# Patient Record
Sex: Male | Born: 1985 | Race: Black or African American | Hispanic: No | Marital: Single | Smoking: Current every day smoker
Health system: Southern US, Community
[De-identification: ages and names within clinical notes are randomized; demographics above are authoritative.]

## PROBLEM LIST (undated history)

## (undated) ENCOUNTER — Emergency Department: Admission: EM | Payer: Self-pay | Source: Home / Self Care

## (undated) DIAGNOSIS — M549 Dorsalgia, unspecified: Secondary | ICD-10-CM

---

## 2020-01-14 ENCOUNTER — Other Ambulatory Visit: Payer: Self-pay

## 2020-07-26 ENCOUNTER — Encounter: Payer: Self-pay | Admitting: *Deleted

## 2020-07-26 ENCOUNTER — Other Ambulatory Visit: Payer: Self-pay

## 2020-07-26 ENCOUNTER — Emergency Department: Payer: Self-pay

## 2020-07-26 DIAGNOSIS — F1590 Other stimulant use, unspecified, uncomplicated: Secondary | ICD-10-CM | POA: Insufficient documentation

## 2020-07-26 DIAGNOSIS — Z20822 Contact with and (suspected) exposure to covid-19: Secondary | ICD-10-CM | POA: Insufficient documentation

## 2020-07-26 DIAGNOSIS — F17200 Nicotine dependence, unspecified, uncomplicated: Secondary | ICD-10-CM | POA: Insufficient documentation

## 2020-07-26 DIAGNOSIS — I309 Acute pericarditis, unspecified: Secondary | ICD-10-CM | POA: Insufficient documentation

## 2020-07-26 LAB — CBC
HCT: 45.4 % (ref 39.0–52.0)
Hemoglobin: 14.4 g/dL (ref 13.0–17.0)
MCH: 23.2 pg — ABNORMAL LOW (ref 26.0–34.0)
MCHC: 31.7 g/dL (ref 30.0–36.0)
MCV: 73.2 fL — ABNORMAL LOW (ref 80.0–100.0)
Platelets: 145 10*3/uL — ABNORMAL LOW (ref 150–400)
RBC: 6.2 MIL/uL — ABNORMAL HIGH (ref 4.22–5.81)
RDW: 17.5 % — ABNORMAL HIGH (ref 11.5–15.5)
WBC: 6.9 10*3/uL (ref 4.0–10.5)
nRBC: 0 % (ref 0.0–0.2)

## 2020-07-26 LAB — BASIC METABOLIC PANEL
Anion gap: 8 (ref 5–15)
BUN: 14 mg/dL (ref 6–20)
CO2: 31 mmol/L (ref 22–32)
Calcium: 9.1 mg/dL (ref 8.9–10.3)
Chloride: 103 mmol/L (ref 98–111)
Creatinine, Ser: 1.13 mg/dL (ref 0.61–1.24)
GFR, Estimated: >60 mL/min (ref >60)
Glucose, Bld: 90 mg/dL (ref 70–99)
Potassium: 3.9 mmol/L (ref 3.5–5.1)
Sodium: 142 mmol/L (ref 135–145)

## 2020-07-26 LAB — FIBRIN DERIVATIVES D-DIMER (ARMC ONLY): Fibrin derivatives D-dimer (ARMC): 166.63 ng/mL (FEU) (ref 0.00–499.00)

## 2020-07-26 LAB — TROPONIN I (HIGH SENSITIVITY)
Troponin I (High Sensitivity): 8 ng/L (ref <18)
Troponin I (High Sensitivity): 7 ng/L (ref <18)

## 2020-07-26 NOTE — ED Notes (Signed)
Pt refused Covid swab.

## 2020-07-26 NOTE — ED Triage Notes (Signed)
Pt states woke @ 0300 this morning w/ shortness of breath and CP. Pt states at the time he felt hot and nauseated. States he used bathroom and returned to bed and to sleep. Pt states he has had persistent chest pain which is reproducible w/ inspiration. Pt denies exposure. Pt states recent in-state travel for vacation.

## 2020-07-27 ENCOUNTER — Emergency Department
Admission: EM | Admit: 2020-07-27 | Discharge: 2020-07-27 | Disposition: A | Discharge status: Home / Self Care | Payer: Self-pay | Attending: Emergency Medicine | Admitting: Emergency Medicine

## 2020-07-27 DIAGNOSIS — I309 Acute pericarditis, unspecified: Secondary | ICD-10-CM

## 2020-07-27 HISTORY — DX: Dorsalgia, unspecified: M54.9

## 2020-07-27 LAB — RESP PANEL BY RT PCR (RSV, FLU A&B, COVID)
Influenza A by PCR: NEGATIVE
Influenza B by PCR: NEGATIVE
Respiratory Syncytial Virus by PCR: NEGATIVE
SARS Coronavirus 2 by RT PCR: NEGATIVE

## 2020-07-27 MED ORDER — IBUPROFEN 800 MG PO TABS: 800.0000 mg | ORAL_TABLET | Freq: Three times a day (TID) | ORAL | 0 refills | Status: AC

## 2020-07-27 MED ORDER — COLCHICINE 0.6 MG PO TABS
0.6000 mg | ORAL_TABLET | Freq: Once | ORAL | Status: AC
  Administered 2020-07-27: 0.6 mg via ORAL
  Filled 2020-07-27: qty 1

## 2020-07-27 MED ORDER — IBUPROFEN 800 MG PO TABS
800.0000 mg | ORAL_TABLET | Freq: Once | ORAL | Status: AC
  Administered 2020-07-27: 800 mg via ORAL
  Filled 2020-07-27: qty 1

## 2020-07-27 MED ORDER — COLCHICINE 0.6 MG PO TABS: 0.6000 mg | ORAL_TABLET | Freq: Two times a day (BID) | ORAL | 0 refills | Status: AC

## 2020-07-27 NOTE — Discharge Instructions (Signed)
Take ibuprofen 800mg  3 times a day and colchicine 0.6mg  twice a day for 7 days. Follow up with your doctor before the end of this treatment. If you are still having symptoms at the end of the week, please see your doctor again or return to the ER. If you have worsening chest pain, difficulty breathing, or fever please return to the ER immediately for re-evaluation.

## 2020-07-27 NOTE — ED Provider Notes (Signed)
[facility] Emergency Department Provider Note  ____________________________________________  Time seen: Approximately 2:44 AM  I have reviewed the triage vital signs and the nursing notes.   HISTORY  Chief Complaint Pleurisy   HPI Samuel Olsen is a 34 y.o. male who presents for evaluation of chest pain.  Symptoms started 24 hours ago.  Patient reports having an edible before bedtime.  He woke up in the middle of the night with chest pain that he describes as sharp, located in the center of his chest, worse with deep inspiration and constant.  He also had a dizziness, nausea, and sore throat.  The symptoms have now resolved but the chest pain has persisted.  No recent GI respiratory illness, no Covid exposure, no Covid vaccination.  Patient denies cough or shortness of breath.  He is a smoker but has no history of asthma or COPD.  No personal family history of blood clots, no recent travel immobilization, no leg pain or swelling, no hemoptysis or exogenous hormones.  No radiation of the pain to his back or paresthesias of his extremities.   Past Medical History:  Diagnosis Date  . Back pain      Prior to Admission medications   Medication Sig Start Date End Date Taking? Authorizing Provider  colchicine 0.6 MG tablet Take 1 tablet (0.6 mg total) by mouth 2 (two) times daily for 7 days. 07/27/20 08/03/20  Nita Sickle, MD  ibuprofen (ADVIL) 800 MG tablet Take 1 tablet (800 mg total) by mouth 3 (three) times daily for 7 days. 07/27/20 08/03/20  Nita Sickle, MD    Allergies Patient has no known allergies.  History reviewed. No pertinent family history.  Social History Social History   Tobacco Use  . Smoking status: Current Every Day Smoker  . Smokeless tobacco: Never Used  Vaping Use  . Vaping Use: Never used  Substance Use Topics  . Alcohol use: Yes    Comment: 1/2 pint of liquor on weekends  . Drug use: Yes    Frequency: 7.0 times per  week    Types: Marijuana    Comment: edibles,     Review of Systems  Constitutional: Negative for fever. + lightheadedness Eyes: Negative for visual changes. ENT: + sore throat. Neck: No neck pain  Cardiovascular: + chest pain. Respiratory: Negative for shortness of breath. Gastrointestinal: Negative for abdominal pain, vomiting or diarrhea. + nausea Genitourinary: Negative for dysuria. Musculoskeletal: Negative for back pain. Skin: Negative for rash. Neurological: Negative for headaches, weakness or numbness. Psych: No SI or HI  ____________________________________________   PHYSICAL EXAM:  VITAL SIGNS: ED Triage Vitals  Enc Vitals Group     BP 07/26/20 1839 120/70     Pulse Rate 07/26/20 1839 70     Resp 07/26/20 1839 16     Temp 07/26/20 1839 99.5 F (37.5 C)     Temp Source 07/26/20 1839 Oral     SpO2 07/26/20 1839 97 %     Weight 07/26/20 1841 175 lb (79.4 kg)     Height 07/26/20 1841 5\' 7"  (1.702 m)     Head Circumference --      Peak Flow --      Pain Score 07/26/20 1840 10     Pain Loc --      Pain Edu? --      Excl. in GC? --     Constitutional: Alert and oriented. Well appearing and in no apparent distress. HEENT:  Head: Normocephalic and atraumatic.         Eyes: Conjunctivae are normal. Sclera is non-icteric.       Mouth/Throat: Mucous membranes are moist.       Neck: Supple with no signs of meningismus. Cardiovascular: Regular rate and rhythm. No murmurs, gallops, or rubs. 2+ symmetrical distal pulses are present in all extremities. No JVD. Respiratory: Normal respiratory effort. Lungs are clear to auscultation bilaterally. No wheezes, crackles, or rhonchi.  Gastrointestinal: Soft, non tender, and non distended. Musculoskeletal: No edema, cyanosis, or erythema of extremities. Neurologic: Normal speech and language. Face is symmetric. Moving all extremities. No gross focal neurologic deficits are appreciated. Skin: Skin is warm, dry and intact.  No rash noted. Psychiatric: Mood and affect are normal. Speech and behavior are normal.  ____________________________________________   LABS (all labs ordered are listed, but only abnormal results are displayed)  Labs Reviewed  CBC - Abnormal; Notable for the following components:      Result Value   RBC 6.20 (*)    MCV 73.2 (*)    MCH 23.2 (*)    RDW 17.5 (*)    Platelets 145 (*)    All other components within normal limits  RESPIRATORY PANEL BY RT PCR (FLU A&B, COVID)  RESP PANEL BY RT PCR (RSV, FLU A&B, COVID)  BASIC METABOLIC PANEL  FIBRIN DERIVATIVES D-DIMER ([facility] ONLY)  TROPONIN I (HIGH SENSITIVITY)  TROPONIN I (HIGH SENSITIVITY)   ____________________________________________  EKG  ED ECG REPORT I, Nita Sickle, the attending physician, personally viewed and interpreted this ECG.  Normal sinus rhythm, rate of 66, normal intervals, normal axis, diffuse ST elevations with no reciprocal changes.  No prior for comparison. ____________________________________________  RADIOLOGY  I have personally reviewed the images performed during this visit and I agree with the Radiologist's read.   Interpretation by Radiologist:  DG Chest 2 View  Result Date: 07/26/2020 CLINICAL DATA:  Chest pain and shortness of breath. EXAM: CHEST - 2 VIEW COMPARISON:  None. FINDINGS: The heart size and mediastinal contours are within normal limits. Both lungs are clear. The visualized skeletal structures are unremarkable. IMPRESSION: No active cardiopulmonary disease. Electronically Signed   By: Katherine Mantle M.D.   On: 07/26/2020 19:48     ____________________________________________   PROCEDURES  Procedure(s) performed: None Procedures Critical Care performed:  None ____________________________________________   INITIAL IMPRESSION / ASSESSMENT AND PLAN / ED COURSE  34 y.o. male who presents for evaluation of pleuritic chest pain. Pain started with sore throat,  lightheadedness, nausea after eating an edible 24 hours ago. CP is the only persistent symptoms.  Patient is extremely well-appearing in no distress, heart regular rate and rhythm with no murmurs, lungs are clear to auscultation, abdomen is soft with no tenderness or palpable masses, extremities are warm and well perfused with strong equal pulses.  Ddx pleurisy, ptx, pna, viral illness, side effect of MJ edible, pericarditis, myocarditis. Perc negative. Aortic dissection considered, however there were with no typical symptoms of chest pain radiating through to intrascapular back, no severe hypertension, symmetric bilateral radial pulses, no associated neurologic deficits, no associated abdominal or lower extremity symptoms, no marfanoid features or evidence of underlying connective tissue disorder, and chest x-ray is without evidence of mediastinal widening.  EKG with diffuse STE and no reciprocal changes concerning for possible pericarditis. Bedside US showing no evidence of pericardial effusion. Normal HS-trop x 2 ruling out myocarditis. D-dimer negative making PE or dissection even less likely. CXR visualized by me with no  infiltrate, PTX, or edema, confirmed by radiology.  CBC otherwise with no significant abnormalities.  History gathered from patient his fiance was at bedside.  Plan discussed with both of them.  Will start patient on colchicine and NSAIDs for possible pericarditis.  Will swab patient for Covid, flu and RSV.  Will discharge home on colchicine and NSAIDs x7 days with referral to primary care doctor for further evaluation.  Recommended return to the emergency room for new or worsening chest pain, back pain, shortness of breath or fever.  Old medical records reviewed.         _____________________________________________ Please note:  Patient was evaluated in Emergency Department today for the symptoms described in the history of present illness. Patient was evaluated in the context  of the global COVID-19 pandemic, which necessitated consideration that the patient might be at risk for infection with the SARS-CoV-2 virus that causes COVID-19. Institutional protocols and algorithms that pertain to the evaluation of patients at risk for COVID-19 are in a state of rapid change based on information released by regulatory bodies including the CDC and federal and state organizations. These policies and algorithms were followed during the patient's care in the ED.  Some ED evaluations and interventions may be delayed as a result of limited staffing during the pandemic.   [place] Controlled Substance Database was reviewed by me. ____________________________________________   FINAL CLINICAL IMPRESSION(S) / ED DIAGNOSES   Final diagnoses:  Acute pericarditis, unspecified type      NEW MEDICATIONS STARTED DURING THIS VISIT:  ED Discharge Orders         Ordered    colchicine 0.6 MG tablet  2 times daily        07/27/20 0241    ibuprofen (ADVIL) 800 MG tablet  3 times daily        07/27/20 0241           Note:  This document was prepared using Dragon voice recognition software and may include unintentional dictation errors.    Don Perking, [place], MD 07/27/20 [phone]

## 2021-07-14 IMAGING — CR DG CHEST 2V
1 series · 2 of 2 positions shown · non-contrast
Comparison: None.

CLINICAL DATA: Chest pain and shortness of breath.

EXAM:
CHEST - 2 VIEW

[Series 1: dg chest 2 view · 0.14mm/px · 2 of 2 slices shown]
[im 1/2]
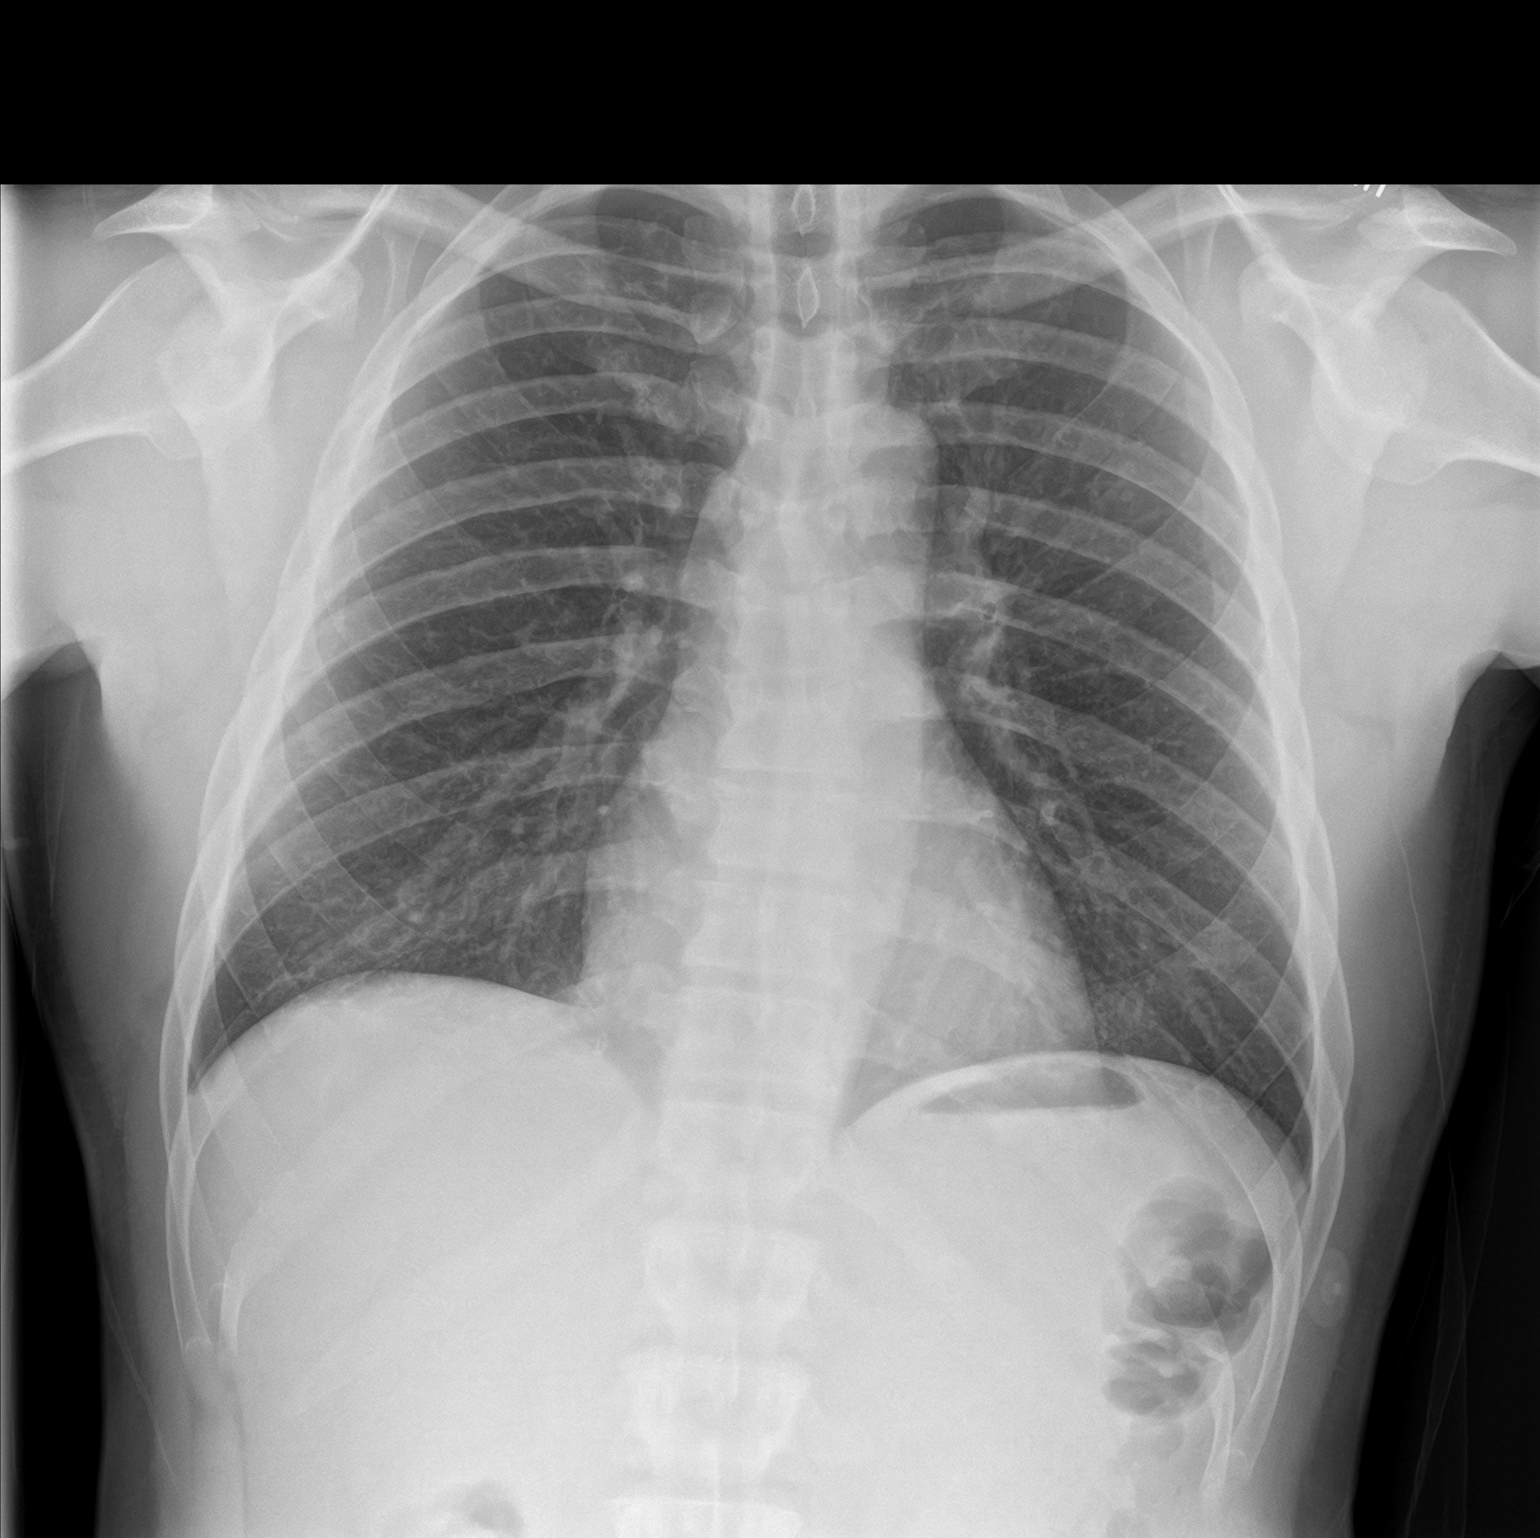
[im 2/2]
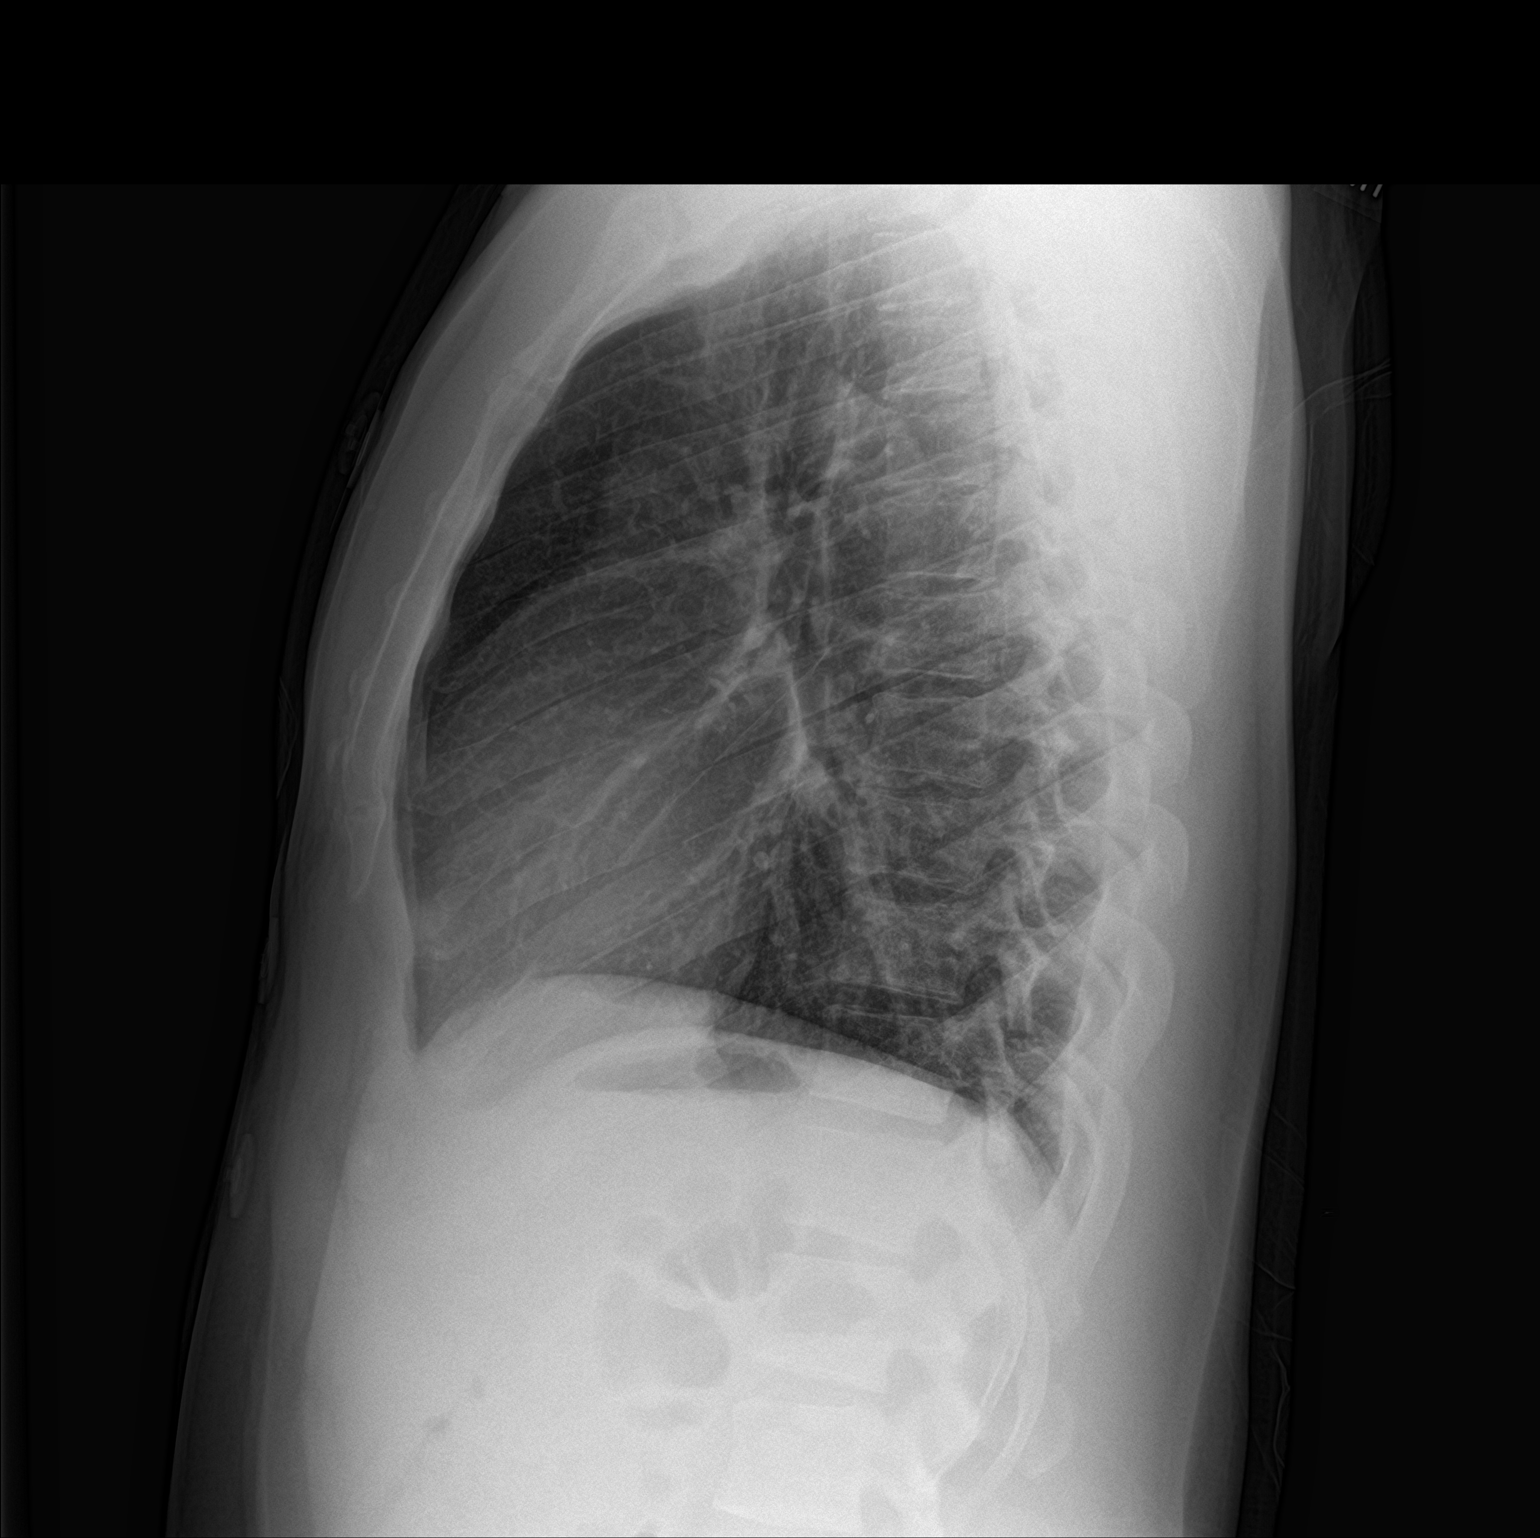

[2 of 2 positions shown; findings below may reference images not displayed]

FINDINGS: The heart size and mediastinal contours are within normal limits.
Both lungs are clear. The visualized skeletal structures are
unremarkable.
IMPRESSION: No active cardiopulmonary disease.
